# Patient Record
Sex: Male | Born: 1990 | Race: White | Marital: Single | Smoking: Never smoker
Health system: Southern US, Community
[De-identification: ages and names within clinical notes are randomized; demographics above are authoritative.]

---

## 2014-08-06 ENCOUNTER — Ambulatory Visit (INDEPENDENT_AMBULATORY_CARE_PROVIDER_SITE_OTHER): Payer: Worker's Compensation

## 2014-08-06 ENCOUNTER — Ambulatory Visit: Payer: Worker's Compensation | Admitting: Internal Medicine

## 2014-08-06 VITALS — BP 130/80 | HR 59 | Temp 98.6°F | Resp 18 | Ht 68.0 in | Wt 153.5 lb

## 2014-08-06 DIAGNOSIS — M25532 Pain in left wrist: Secondary | ICD-10-CM

## 2014-08-06 NOTE — Progress Notes (Signed)
   Subjective:  This chart was scribed for Ronald Siaobert Hally Colella, MD by Stann Oresung-Kai Tsai, Medical Scribe. This patient was seen in Room 8 and the patient's care was started at 10:01 AM.     Patient ID: Ronald Delacruz, male    DOB: [birth year], 24 y.o.   MRN: [id number]  HPI Ronald Delacruz is a 24 y.o. male who [facility] complaining of left wrist injury that occurred yesterday. 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 He fell and braced himself with his left wrist.  He now has pain with use He is right handed.   There are no active problems to display for this patient.  No current outpatient prescriptions on file.     Review of Systems  Constitutional: Negative for fever, chills, diaphoresis and fatigue.  Respiratory: Negative for cough.   Gastrointestinal: Negative for nausea, vomiting, diarrhea and constipation.  Musculoskeletal: Positive for arthralgias (left wrist).  Skin: Negative for rash and wound.  Neurological: Negative for dizziness and numbness.       Objective:   Physical Exam  Constitutional: He is oriented to person, place, and time. He appears well-developed and well-nourished. No distress.  HENT:  Head: Normocephalic and atraumatic.  Eyes: EOM are normal. Pupils are equal, round, and reactive to light.  Neck: Neck supple.  Cardiovascular: Normal rate.   Pulmonary/Chest: Effort normal. No respiratory distress.  Musculoskeletal: Normal range of motion.  He has also swelling of the distal forearm on the radial aspect with tenderness in the same position but no tenderness in the snuffbox range of motion is good except for pain with full extension and full flexion There is no redness  Neurological: He is alert and oriented to person, place, and time.  Skin: Skin is warm and dry.  Psychiatric: He has a normal mood and affect. His behavior is normal.  Nursing note and vitals reviewed.   BP 130/80 mmHg  Pulse 59   Temp(Src) 98.6 F (37 C) (Oral)  Resp 18  Ht 5\' 8"  (1.727 m)  Wt 153 lb 8 oz (69.627 kg)  BMI 23.34 kg/m2  SpO2 99%  [facility] reading (PRIMARY) by  Dr. Merla Richesoolittle-= no fracture about the wrist.       Assessment & Plan:  Pain, wrist joint, left   secondary to sprain injury  Thumb spica splint for 2 weeks Range of motion exercises Ice for pain control  I have completed the patient encounter in its entirety as documented by the scribe, with editing by me where necessary. Berish Bohman P. Merla Richesoolittle, M.D.

## 2014-10-03 ENCOUNTER — Ambulatory Visit (INDEPENDENT_AMBULATORY_CARE_PROVIDER_SITE_OTHER): Payer: Worker's Compensation | Admitting: Family Medicine

## 2014-10-03 VITALS — BP 110/60 | HR 67 | Temp 98.5°F | Resp 16 | Ht 68.0 in | Wt 147.6 lb

## 2014-10-03 DIAGNOSIS — M25532 Pain in left wrist: Secondary | ICD-10-CM | POA: Diagnosis not present

## 2014-10-03 MED ORDER — MELOXICAM 15 MG PO TABS: 15.0000 mg | ORAL_TABLET | Freq: Every day | ORAL | Status: AC

## 2014-10-03 NOTE — Progress Notes (Signed)
Subjective:  This chart was scribed for Norberto Sorenson MD, by Veverly Fells, at [facility].  This patient was seen in room 11 and the patient's care was started at 12:41 PM.    Patient ID: Ronald Delacruz, male    DOB: 12-May-1990, 24 y.o.   MRN: [id number]  Chief Complaint  Patient presents with  . Follow-up    wrist pain, x 8 weeks    HPI   HPI Comments: Ronald Delacruz is a 24 y.o. male who presents to the [facility] for a follow up regarding his left wrist pain.  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  He was using his brace for a while but states that he got "sick of it" and wasn't as carefull as he should have been.  He denies icing it for the past month.  He has no history of wrist injuries prior.  He has not seen any orthopedic surgeons in town. He is not using any over the counter medication.    ------ Patient was seen two months ago the day after a fall. Normal wrist x-ray with pain and swelling on the radial aspect. He was placed ina thumb spica splint for two weeks with ice and ROM exercises to treat this sprain.    History reviewed. No pertinent past medical history.  No current outpatient prescriptions on file prior to visit.   No current facility-administered medications on file prior to visit.    No Known Allergies  He is from [facility].    Review of Systems  Constitutional: Negative for fever and chills.  Eyes: Negative for pain, discharge, redness and itching.  Respiratory: Negative for cough and shortness of breath.   Cardiovascular: Negative for chest pain.  Gastrointestinal: Negative for nausea and vomiting.  Musculoskeletal: Positive for arthralgias. Negative for neck pain and neck stiffness.       Objective:   Physical Exam  Constitutional: He is oriented to person, place, and time. He appears well-developed and well-nourished. No distress.  HENT:  Head:  Normocephalic and atraumatic.  Eyes: Pupils are equal, round, and reactive to light.  Cardiovascular: Normal rate.   Pulmonary/Chest: Effort normal. No respiratory distress.  Musculoskeletal: Normal range of motion.  2+ radial and ulnar pulses.   mild point tenderness immediately proximal to the ulnar head No radial tenderness Stressing of the ulnar colat ligaments we have increased tenderness.  Question of mild increase laxity. Full range of motion supine and prone.  Normal flexion and extension No tenderness over the ulnar aspect of the carpal bones.   Neurological: He is alert and oriented to person, place, and time.  Skin: Skin is warm and dry.  Psychiatric: He has a normal mood and affect. His behavior is normal.  Nursing note and vitals reviewed.   Filed Vitals:   10/03/14 1215  BP: 110/60  Pulse: 67  Temp: 98.5 F (36.9 C)  TempSrc: Oral  Resp: 16  Height: 5\' 8"  (1.727 m)  Weight: 147 lb 9.6 oz (66.951 kg)  SpO2: 99%       Assessment & Plan:   1. Wrist pain, acute, left   Pt with wrist sprain 2 mos prev but did not allow adequate healing time w/ splint due to job as Systems analyst so has continued to have pain. Will proceed w/ MRI to help identify precise injury so pt can have prognosis and start on  more focused rehab since it is impacting his livelihood. Restart RICE. Low threshhold for ortho referral.  Orders Placed This Encounter  Procedures  . MR Wrist Left Wo Contrast    Standing Status: Future     Number of Occurrences:      Standing Expiration Date: 12/03/2015    Order Specific Question:  Reason for Exam (SYMPTOM  OR DIAGNOSIS REQUIRED)    Answer:  pain over proximal ulna afte wirst sprain 2 mos prior    Order Specific Question:  Preferred imaging location?    Answer:  [id number]    Order Specific Question:  Does the patient have a pacemaker or implanted devices?    Answer:  No    Order Specific Question:  What is the patient's sedation  requirement?    Answer:  No Sedation    Meds ordered this encounter  Medications  . meloxicam (MOBIC) 15 MG tablet    Sig: Take 1 tablet (15 mg total) by mouth daily.    Dispense:  30 tablet    Refill:  1    I personally performed the services described in this documentation, which was scribed in my presence. The recorded information has been reviewed and considered, and addended by me as needed.  Norberto Sorenson, MD MPH

## 2014-10-03 NOTE — Patient Instructions (Signed)
Do not use the meloxicam with any other otc pain medication other than tylenol/acetaminophen - so no aleve, ibuprofen, motrin, advil, etc.  Wrist Sprain with Rehab A sprain is an injury in which a ligament that maintains the proper alignment of a joint is partially or completely torn. The ligaments of the wrist are susceptible to sprains. Sprains are classified into three categories. Grade 1 sprains cause pain, but the tendon is not lengthened. Grade 2 sprains include a lengthened ligament because the ligament is stretched or partially ruptured. With grade 2 sprains there is still function, although the function may be diminished. Grade 3 sprains are characterized by a complete tear of the tendon or muscle, and function is usually impaired. SYMPTOMS   Pain tenderness, inflammation, and/or bruising (contusion) of the injury.  A "pop" or tear felt and/or heard at the time of injury.  Decreased wrist function. CAUSES  A wrist sprain occurs when a force is placed on one or more ligaments that is greater than it/they can withstand. Common mechanisms of injury include:  Catching a ball with you hands.  Repetitive and/ or strenuous extension or flexion of the wrist. RISK INCREASES WITH:  Previous wrist injury.  Contact sports (boxing or wrestling).  Activities in which falling is common.  Poor strength and flexibility.  Improperly fitted or padded protective equipment. PREVENTION  Warm up and stretch properly before activity.  Allow for adequate recovery between workouts.  Maintain physical fitness:  Strength, flexibility, and endurance.  Cardiovascular fitness.  Protect the wrist joint by limiting its motion with the use of taping, braces, or splints.  Protect the wrist after injury for 6 to 12 months. PROGNOSIS  The prognosis for wrist sprains depends on the degree of injury. Grade 1 sprains require 2 to 6 weeks of treatment. Grade 2 sprains require 6 to 8 weeks of treatment,  and grade 3 sprains require up to 12 weeks.  RELATED COMPLICATIONS   Prolonged healing time, if improperly treated or re-injured.  Recurrent symptoms that result in a chronic problem.  Injury to nearby structures (bone, cartilage, nerves, or tendons).  Arthritis of the wrist.  Inability to compete in athletics at a high level.  Wrist stiffness or weakness.  Progression to a complete rupture of the ligament. TREATMENT  Treatment initially involves resting from any activities that aggravate the symptoms, and the use of ice and medications to help reduce pain and inflammation. Your caregiver may recommend immobilizing the wrist for a period of time in order to reduce stress on the ligament and allow for healing. After immobilization it is important to perform strengthening and stretching exercises to help regain strength and a full range of motion. These exercises may be completed at home or with a therapist. Surgery is not usually required for wrist sprains, unless the ligament has been ruptured (grade 3 sprain). MEDICATION   If pain medication is necessary, then nonsteroidal anti-inflammatory medications, such as aspirin and ibuprofen, or other minor pain relievers, such as acetaminophen, are often recommended.  Do not take pain medication for 7 days before surgery.  Prescription pain relievers may be given if deemed necessary by your caregiver. Use only as directed and only as much as you need. HEAT AND COLD  Cold treatment (icing) relieves pain and reduces inflammation. Cold treatment should be applied for 10 to 15 minutes every 2 to 3 hours for inflammation and pain and immediately after any activity that aggravates your symptoms. Use ice packs or massage the area with  a piece of ice (ice massage).  Heat treatment may be used prior to performing the stretching and strengthening activities prescribed by your caregiver, physical therapist, or athletic trainer. Use a heat pack or soak  your injury in warm water. SEEK MEDICAL CARE IF:  Treatment seems to offer no benefit, or the condition worsens.  Any medications produce adverse side effects. EXERCISES RANGE OF MOTION (ROM) AND STRETCHING EXERCISES - Wrist Sprain  These exercises may help you when beginning to rehabilitate your injury. Your symptoms may resolve with or without further involvement from your physician, physical therapist or athletic trainer. While completing these exercises, remember:   Restoring tissue flexibility helps normal motion to return to the joints. This allows healthier, less painful movement and activity.  An effective stretch should be held for at least 30 seconds.  A stretch should never be painful. You should only feel a gentle lengthening or release in the stretched tissue. RANGE OF MOTION - Wrist Flexion, Active-Assisted  Extend your right / left elbow with your fingers pointing down.*  Gently pull the back of your hand towards you until you feel a gentle stretch on the top of your forearm.  Hold this position for __________ seconds. Repeat __________ times. Complete this exercise __________ times per day.  *If directed by your physician, physical therapist or athletic trainer, complete this stretch with your elbow bent rather than extended. RANGE OF MOTION - Wrist Extension, Active-Assisted  Extend your right / left elbow and turn your palm upwards.*  Gently pull your palm/fingertips back so your wrist extends and your fingers point more toward the ground.  You should feel a gentle stretch on the inside of your forearm.  Hold this position for __________ seconds. Repeat __________ times. Complete this exercise __________ times per day. *If directed by your physician, physical therapist or athletic trainer, complete this stretch with your elbow bent, rather than extended. RANGE OF MOTION - Supination, Active  Stand or sit with your elbows at your side. Bend your right / left  elbow to 90 degrees.  Turn your palm upward until you feel a gentle stretch on the inside of your forearm.  Hold this position for __________ seconds. Slowly release and return to the starting position. Repeat __________ times. Complete this stretch __________ times per day.  RANGE OF MOTION - Pronation, Active  Stand or sit with your elbows at your side. Bend your right / left elbow to 90 degrees.  Turn your palm downward until you feel a gentle stretch on the top of your forearm.  Hold this position for __________ seconds. Slowly release and return to the starting position. Repeat __________ times. Complete this stretch __________ times per day.  STRETCH - Wrist Flexion  Place the back of your right / left hand on a tabletop leaving your elbow slightly bent. Your fingers should point away from your body.  Gently press the back of your hand down onto the table by straightening your elbow. You should feel a stretch on the top of your forearm.  Hold this position for __________ seconds. Repeat __________ times. Complete this stretch __________ times per day.  STRETCH - Wrist Extension  Place your right / left fingertips on a tabletop leaving your elbow slightly bent. Your fingers should point backwards.  Gently press your fingers and palm down onto the table by straightening your elbow. You should feel a stretch on the inside of your forearm.  Hold this position for __________ seconds. Repeat __________ times. Complete  this stretch __________ times per day.  STRENGTHENING EXERCISES - Wrist Sprain These exercises may help you when beginning to rehabilitate your injury. They may resolve your symptoms with or without further involvement from your physician, physical therapist or athletic trainer. While completing these exercises, remember:   Muscles can gain both the endurance and the strength needed for everyday activities through controlled exercises.  Complete these exercises as  instructed by your physician, physical therapist or athletic trainer. Progress with the resistance and repetition exercises only as your caregiver advises. STRENGTH - Wrist Flexors  Sit with your right / left forearm palm-up and fully supported. Your elbow should be resting below the height of your shoulder. Allow your wrist to extend over the edge of the surface.  Loosely holding a __________ weight or a piece of rubber exercise band/tubing, slowly curl your hand up toward your forearm.  Hold this position for __________ seconds. Slowly lower the wrist back to the starting position in a controlled manner. Repeat __________ times. Complete this exercise __________ times per day.  STRENGTH - Wrist Extensors  Sit with your right / left forearm palm-down and fully supported. Your elbow should be resting below the height of your shoulder. Allow your wrist to extend over the edge of the surface.  Loosely holding a __________ weight or a piece of rubber exercise band/tubing, slowly curl your hand up toward your forearm.  Hold this position for __________ seconds. Slowly lower the wrist back to the starting position in a controlled manner. Repeat __________ times. Complete this exercise __________ times per day.  STRENGTH - Ulnar Deviators  Stand with a ____________________ weight in your right / left hand, or sit holding on to the rubber exercise band/tubing with your opposite arm supported.  Move your wrist so that your pinkie travels toward your forearm and your thumb moves away from your forearm.  Hold this position for __________ seconds and then slowly lower the wrist back to the starting position. Repeat __________ times. Complete this exercise __________ times per day STRENGTH - Radial Deviators  Stand with a ____________________ weight in your  right / left hand, or sit holding on to the rubber exercise band/tubing with your arm supported.  Raise your hand upward in front of you or  pull up on the rubber tubing.  Hold this position for __________ seconds and then slowly lower the wrist back to the starting position. Repeat __________ times. Complete this exercise __________ times per day. STRENGTH - Forearm Supinators  Sit with your right / left forearm supported on a table, keeping your elbow below shoulder height. Rest your hand over the edge, palm down.  Gently grip a hammer or a soup ladle.  Without moving your elbow, slowly turn your palm and hand upward to a "thumbs-up" position.  Hold this position for __________ seconds. Slowly return to the starting position. Repeat __________ times. Complete this exercise __________ times per day.  STRENGTH - Forearm Pronators  Sit with your right / left forearm supported on a table, keeping your elbow below shoulder height. Rest your hand over the edge, palm up.  Gently grip a hammer or a soup ladle.  Without moving your elbow, slowly turn your palm and hand upward to a "thumbs-up" position.  Hold this position for __________ seconds. Slowly return to the starting position. Repeat __________ times. Complete this exercise __________ times per day.  STRENGTH - Grip  Grasp a tennis ball, a dense sponge, or a large, rolled sock in your  hand.  Squeeze as hard as you can without increasing any pain.  Hold this position for __________ seconds. Release your grip slowly. Repeat __________ times. Complete this exercise __________ times per day.  Document Released: 01/06/2005 Document Revised: 03/31/2011 Document Reviewed: 04/20/2008 [facility] Patient Information 2015 [place], [place]. This information is not intended to replace advice given to you by your health care provider. Make sure you discuss any questions you have with your health care provider.

## 2016-02-04 IMAGING — CR DG WRIST COMPLETE 3+V*L*
3 series · 3 of 3 positions shown · non-contrast
Comparison: None.

CLINICAL DATA: Fall, left wrist injury

EXAM:
LEFT WRIST - COMPLETE 3+ VIEW

[PA]
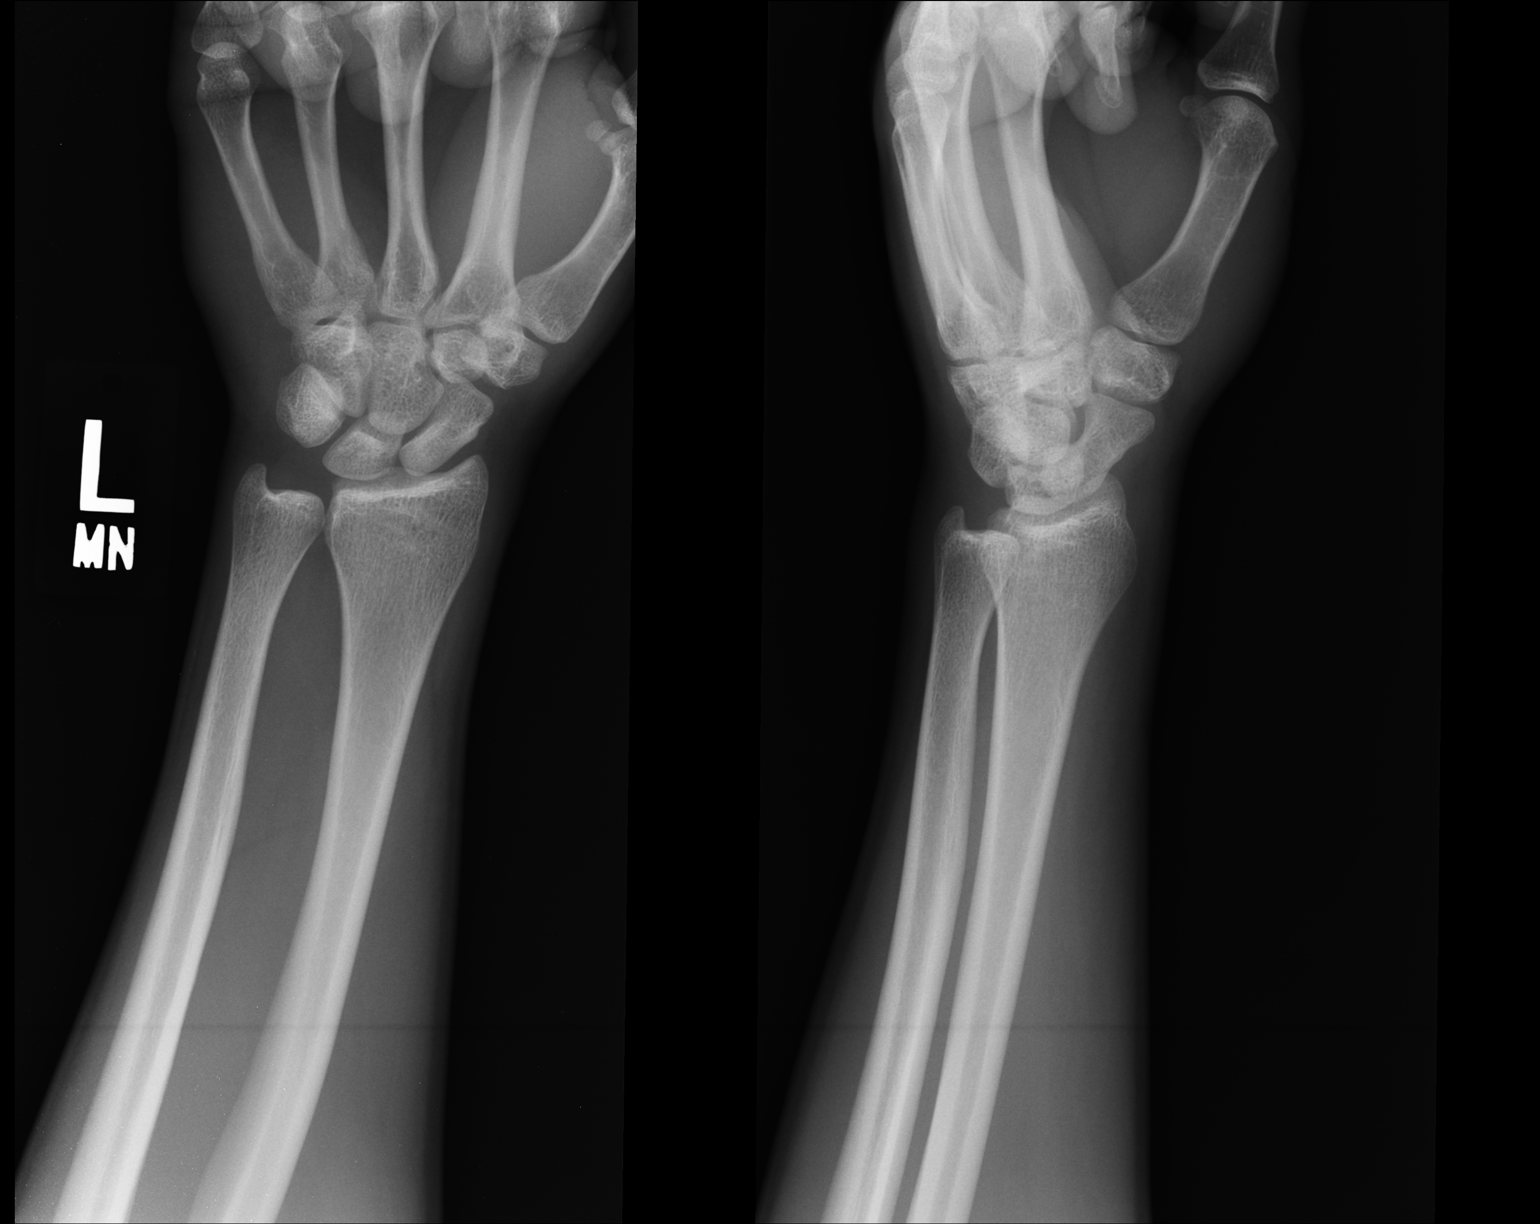

[pa int rot]
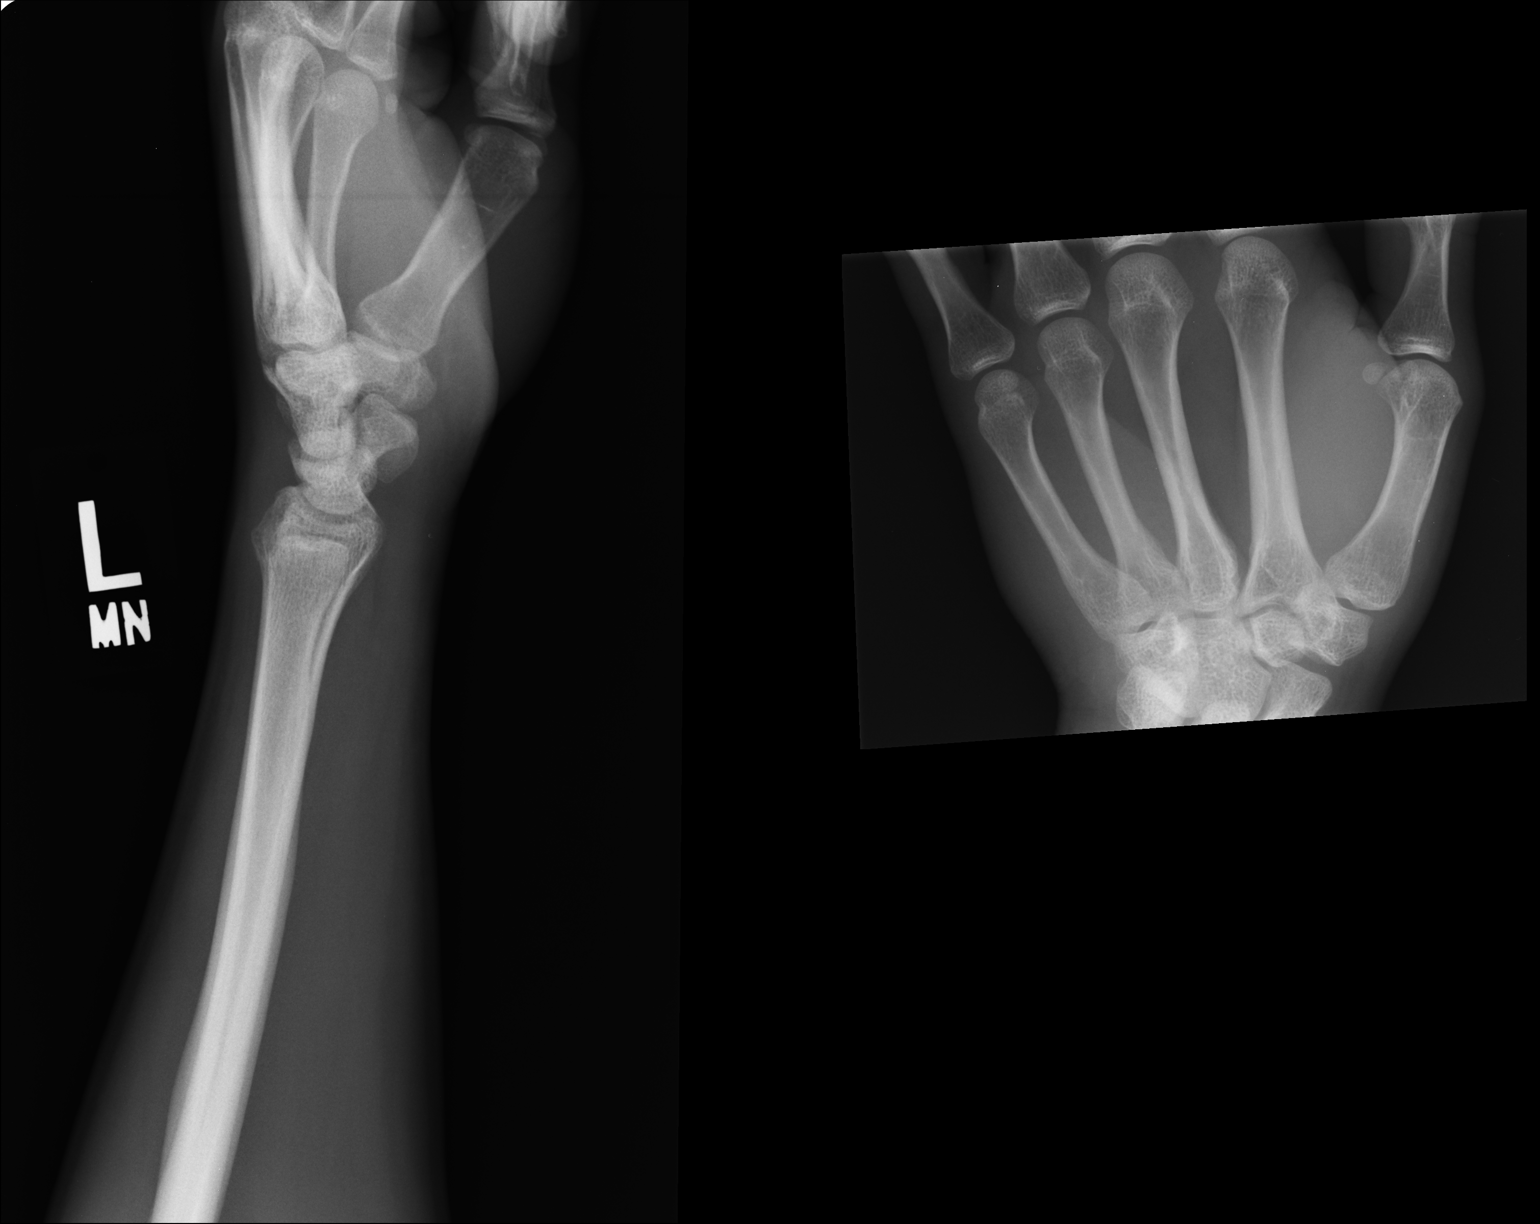

[lateral]
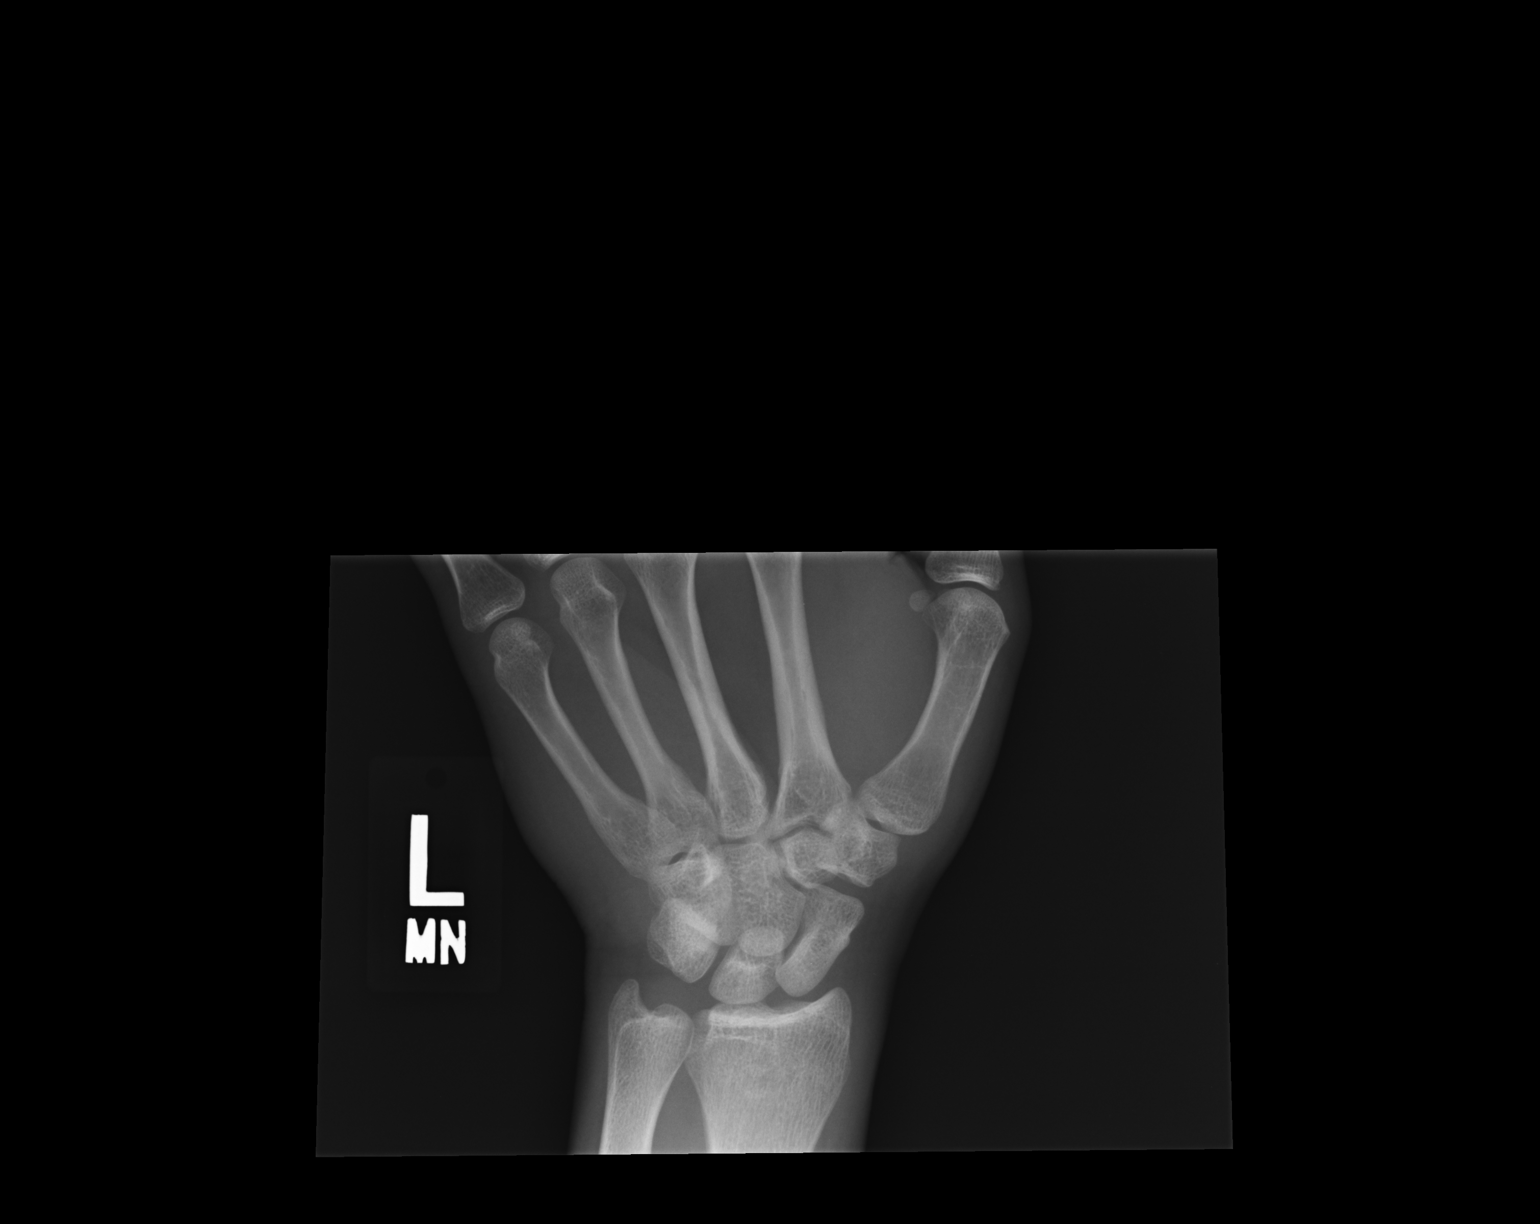

[3 of 3 positions shown; findings below may reference images not displayed]

FINDINGS: No fracture or dislocation is seen.

The joint spaces are preserved.

The visualized soft tissues are unremarkable.
IMPRESSION: No fracture or dislocation is seen.
